# Patient Record
Sex: Female | Born: 1935 | Race: White | Hispanic: No
Health system: Southern US, Community
[De-identification: ages and names within clinical notes are randomized; demographics above are authoritative.]

---

## 1997-12-04 ENCOUNTER — Ambulatory Visit (HOSPITAL_COMMUNITY): Admission: RE | Admit: 1997-12-04 | Discharge: 1997-12-04 | Payer: Self-pay | Admitting: Internal Medicine

## 1997-12-04 ENCOUNTER — Encounter: Payer: Self-pay | Admitting: Internal Medicine

## 1998-12-09 ENCOUNTER — Ambulatory Visit (HOSPITAL_COMMUNITY): Admission: RE | Admit: 1998-12-09 | Discharge: 1998-12-09 | Payer: Self-pay | Admitting: Internal Medicine

## 1998-12-09 ENCOUNTER — Encounter: Payer: Self-pay | Admitting: Internal Medicine

## 1999-02-02 ENCOUNTER — Ambulatory Visit (HOSPITAL_COMMUNITY): Admission: RE | Admit: 1999-02-02 | Discharge: 1999-02-02 | Payer: Self-pay | Admitting: Gastroenterology

## 1999-03-15 ENCOUNTER — Ambulatory Visit: Admission: RE | Admit: 1999-03-15 | Discharge: 1999-03-15 | Payer: Self-pay | Admitting: Ophthalmology

## 1999-12-13 ENCOUNTER — Encounter: Payer: Self-pay | Admitting: Internal Medicine

## 1999-12-13 ENCOUNTER — Ambulatory Visit (HOSPITAL_COMMUNITY): Admission: RE | Admit: 1999-12-13 | Discharge: 1999-12-13 | Payer: Self-pay | Admitting: Internal Medicine

## 2001-03-13 ENCOUNTER — Ambulatory Visit (HOSPITAL_COMMUNITY): Admission: RE | Admit: 2001-03-13 | Discharge: 2001-03-13 | Payer: Self-pay | Admitting: Internal Medicine

## 2001-03-13 ENCOUNTER — Encounter: Payer: Self-pay | Admitting: Internal Medicine

## 2001-05-29 ENCOUNTER — Other Ambulatory Visit: Admission: RE | Admit: 2001-05-29 | Discharge: 2001-05-29 | Payer: Self-pay | Admitting: Internal Medicine

## 2002-02-27 ENCOUNTER — Ambulatory Visit (HOSPITAL_COMMUNITY): Admission: RE | Admit: 2002-02-27 | Discharge: 2002-02-27 | Payer: Self-pay | Admitting: Internal Medicine

## 2002-02-27 ENCOUNTER — Encounter: Payer: Self-pay | Admitting: Internal Medicine

## 2002-10-06 ENCOUNTER — Encounter (INDEPENDENT_AMBULATORY_CARE_PROVIDER_SITE_OTHER): Payer: Self-pay | Admitting: *Deleted

## 2002-10-06 ENCOUNTER — Encounter: Payer: Self-pay | Admitting: Emergency Medicine

## 2002-10-06 ENCOUNTER — Encounter: Payer: Self-pay | Admitting: General Surgery

## 2002-10-06 ENCOUNTER — Inpatient Hospital Stay (HOSPITAL_COMMUNITY): Admission: EM | Admit: 2002-10-06 | Discharge: 2002-10-12 | Payer: Self-pay | Admitting: Emergency Medicine

## 2002-10-28 ENCOUNTER — Encounter: Payer: Self-pay | Admitting: General Surgery

## 2002-10-28 ENCOUNTER — Ambulatory Visit (HOSPITAL_COMMUNITY): Admission: RE | Admit: 2002-10-28 | Discharge: 2002-10-28 | Payer: Self-pay | Admitting: General Surgery

## 2003-07-24 ENCOUNTER — Ambulatory Visit (HOSPITAL_COMMUNITY): Admission: RE | Admit: 2003-07-24 | Discharge: 2003-07-24 | Payer: Self-pay | Admitting: Internal Medicine

## 2004-09-07 ENCOUNTER — Ambulatory Visit (HOSPITAL_COMMUNITY): Admission: RE | Admit: 2004-09-07 | Discharge: 2004-09-07 | Payer: Self-pay | Admitting: Internal Medicine

## 2005-09-22 ENCOUNTER — Ambulatory Visit (HOSPITAL_COMMUNITY): Admission: RE | Admit: 2005-09-22 | Discharge: 2005-09-22 | Payer: Self-pay | Admitting: Internal Medicine

## 2006-09-07 ENCOUNTER — Ambulatory Visit (HOSPITAL_COMMUNITY): Admission: RE | Admit: 2006-09-07 | Discharge: 2006-09-07 | Payer: Self-pay | Admitting: Internal Medicine

## 2006-10-12 ENCOUNTER — Ambulatory Visit (HOSPITAL_COMMUNITY): Admission: RE | Admit: 2006-10-12 | Discharge: 2006-10-12 | Payer: Self-pay | Admitting: Internal Medicine

## 2007-12-04 ENCOUNTER — Ambulatory Visit (HOSPITAL_COMMUNITY): Admission: RE | Admit: 2007-12-04 | Discharge: 2007-12-04 | Payer: Self-pay | Admitting: Internal Medicine

## 2009-12-02 ENCOUNTER — Ambulatory Visit: Payer: Self-pay | Admitting: Cardiology

## 2009-12-02 ENCOUNTER — Inpatient Hospital Stay (HOSPITAL_COMMUNITY): Admission: EM | Admit: 2009-12-02 | Discharge: 2009-12-10 | Payer: Self-pay | Admitting: Emergency Medicine

## 2009-12-04 ENCOUNTER — Encounter (INDEPENDENT_AMBULATORY_CARE_PROVIDER_SITE_OTHER): Payer: Self-pay | Admitting: Internal Medicine

## 2009-12-05 ENCOUNTER — Encounter (INDEPENDENT_AMBULATORY_CARE_PROVIDER_SITE_OTHER): Payer: Self-pay | Admitting: Internal Medicine

## 2010-01-09 ENCOUNTER — Inpatient Hospital Stay (HOSPITAL_COMMUNITY): Admission: EM | Admit: 2010-01-09 | Discharge: 2010-01-10 | Payer: Self-pay | Admitting: Emergency Medicine

## 2010-07-01 LAB — CBC
HCT: 34 % — ABNORMAL LOW (ref 36.0–46.0)
HCT: 34.9 % — ABNORMAL LOW (ref 36.0–46.0)
HCT: 35.7 % — ABNORMAL LOW (ref 36.0–46.0)
Hemoglobin: 11.4 g/dL — ABNORMAL LOW (ref 12.0–15.0)
Hemoglobin: 11.6 g/dL — ABNORMAL LOW (ref 12.0–15.0)
Hemoglobin: 11.6 g/dL — ABNORMAL LOW (ref 12.0–15.0)
MCH: 30.8 pg (ref 26.0–34.0)
MCH: 30.5 pg (ref 26.0–34.0)
MCH: 30.6 pg (ref 26.0–34.0)
MCH: 31.5 pg (ref 26.0–34.0)
MCHC: 32.9 g/dL (ref 30.0–36.0)
MCV: 93.7 fL (ref 78.0–100.0)
MCV: 90.3 fL (ref 78.0–100.0)
MCV: 90.9 fL (ref 78.0–100.0)
MCV: 92.2 fL (ref 78.0–100.0)
MCV: 92.4 fL (ref 78.0–100.0)
MCV: 92.8 fL (ref 78.0–100.0)
Platelets: 148 10*3/uL — ABNORMAL LOW (ref 150–400)
Platelets: 103 10*3/uL — ABNORMAL LOW (ref 150–400)
Platelets: 113 10*3/uL — ABNORMAL LOW (ref 150–400)
Platelets: 143 10*3/uL — ABNORMAL LOW (ref 150–400)
Platelets: 147 10*3/uL — ABNORMAL LOW (ref 150–400)
Platelets: 159 10*3/uL (ref 150–400)
RBC: 4.12 MIL/uL (ref 3.87–5.11)
RBC: 3.68 MIL/uL — ABNORMAL LOW (ref 3.87–5.11)
RBC: 3.74 MIL/uL — ABNORMAL LOW (ref 3.87–5.11)
RBC: 3.87 MIL/uL (ref 3.87–5.11)
RBC: 4.16 MIL/uL (ref 3.87–5.11)
RBC: 4.27 MIL/uL (ref 3.87–5.11)
RDW: 14.6 % (ref 11.5–15.5)
RDW: 15.4 % (ref 11.5–15.5)
WBC: 10.4 10*3/uL (ref 4.0–10.5)
WBC: 6 10*3/uL (ref 4.0–10.5)
WBC: 7.3 10*3/uL (ref 4.0–10.5)
WBC: 7.7 10*3/uL (ref 4.0–10.5)
WBC: 8.1 10*3/uL (ref 4.0–10.5)
WBC: 8.3 10*3/uL (ref 4.0–10.5)

## 2010-07-01 LAB — BASIC METABOLIC PANEL
BUN: 14 mg/dL (ref 6–23)
BUN: 25 mg/dL — ABNORMAL HIGH (ref 6–23)
BUN: 26 mg/dL — ABNORMAL HIGH (ref 6–23)
CO2: 27 mEq/L (ref 19–32)
CO2: 29 mEq/L (ref 19–32)
Calcium: 9.1 mg/dL (ref 8.4–10.5)
Chloride: 100 mEq/L (ref 96–112)
Chloride: 103 mEq/L (ref 96–112)
Chloride: 103 mEq/L (ref 96–112)
Chloride: 106 mEq/L (ref 96–112)
Chloride: 106 mEq/L (ref 96–112)
Creatinine, Ser: 1.06 mg/dL (ref 0.4–1.2)
Creatinine, Ser: 1.13 mg/dL (ref 0.4–1.2)
Creatinine, Ser: 1.17 mg/dL (ref 0.4–1.2)
Creatinine, Ser: 1.2 mg/dL (ref 0.4–1.2)
GFR calc Af Amer: 51 mL/min — ABNORMAL LOW (ref >60)
GFR calc Af Amer: 59 mL/min — ABNORMAL LOW (ref >60)
GFR calc non Af Amer: 42 mL/min — ABNORMAL LOW (ref >60)
GFR calc non Af Amer: 40 mL/min — ABNORMAL LOW (ref >60)
GFR calc non Af Amer: 43 mL/min — ABNORMAL LOW (ref >60)
GFR calc non Af Amer: 49 mL/min — ABNORMAL LOW (ref >60)
Glucose, Bld: 101 mg/dL — ABNORMAL HIGH (ref 70–99)
Glucose, Bld: 99 mg/dL (ref 70–99)
Glucose, Bld: 99 mg/dL (ref 70–99)
Potassium: 3.5 mEq/L (ref 3.5–5.1)
Potassium: 3.9 mEq/L (ref 3.5–5.1)
Potassium: 4.1 mEq/L (ref 3.5–5.1)
Potassium: 4.2 mEq/L (ref 3.5–5.1)
Potassium: 4.2 mEq/L (ref 3.5–5.1)
Potassium: 4.5 mEq/L (ref 3.5–5.1)
Potassium: 4.8 mEq/L (ref 3.5–5.1)
Sodium: 141 mEq/L (ref 135–145)
Sodium: 140 mEq/L (ref 135–145)
Sodium: 140 mEq/L (ref 135–145)
Sodium: 142 mEq/L (ref 135–145)

## 2010-07-01 LAB — CARDIAC PANEL(CRET KIN+CKTOT+MB+TROPI)
CK, MB: 3 ng/mL (ref 0.3–4.0)
CK, MB: 3.1 ng/mL (ref 0.3–4.0)
CK, MB: 5.7 ng/mL — ABNORMAL HIGH (ref 0.3–4.0)
Relative Index: INVALID (ref 0.0–2.5)
Relative Index: INVALID (ref 0.0–2.5)
Total CK: 44 U/L (ref 7–177)
Troponin I: 0.3 ng/mL — ABNORMAL HIGH (ref 0.00–0.06)
Troponin I: 0.44 ng/mL — ABNORMAL HIGH (ref 0.00–0.06)
Troponin I: 0.55 ng/mL (ref 0.00–0.06)
Troponin I: 0.85 ng/mL (ref 0.00–0.06)
Troponin I: 0.94 ng/mL (ref 0.00–0.06)

## 2010-07-01 LAB — COMPREHENSIVE METABOLIC PANEL
ALT: 27 U/L (ref 0–35)
ALT: 22 U/L (ref 0–35)
ALT: 22 U/L (ref 0–35)
AST: 32 U/L (ref 0–37)
AST: 28 U/L (ref 0–37)
AST: 34 U/L (ref 0–37)
Alkaline Phosphatase: 74 U/L (ref 39–117)
Alkaline Phosphatase: 88 U/L (ref 39–117)
CO2: 30 mEq/L (ref 19–32)
CO2: 23 mEq/L (ref 19–32)
CO2: 24 mEq/L (ref 19–32)
Calcium: 9.5 mg/dL (ref 8.4–10.5)
Calcium: 9.1 mg/dL (ref 8.4–10.5)
Chloride: 104 mEq/L (ref 96–112)
Chloride: 105 mEq/L (ref 96–112)
Chloride: 96 mEq/L (ref 96–112)
Creatinine, Ser: 0.96 mg/dL (ref 0.4–1.2)
Creatinine, Ser: 1.01 mg/dL (ref 0.4–1.2)
GFR calc Af Amer: >60 mL/min (ref >60)
GFR calc Af Amer: 47 mL/min — ABNORMAL LOW (ref >60)
GFR calc Af Amer: >60 mL/min (ref >60)
GFR calc non Af Amer: 55 mL/min — ABNORMAL LOW (ref >60)
GFR calc non Af Amer: 38 mL/min — ABNORMAL LOW (ref >60)
GFR calc non Af Amer: 51 mL/min — ABNORMAL LOW (ref >60)
Glucose, Bld: 103 mg/dL — ABNORMAL HIGH (ref 70–99)
Potassium: 4 mEq/L (ref 3.5–5.1)
Potassium: 4 mEq/L (ref 3.5–5.1)
Sodium: 128 mEq/L — ABNORMAL LOW (ref 135–145)
Sodium: 137 mEq/L (ref 135–145)
Total Bilirubin: 1.2 mg/dL (ref 0.3–1.2)
Total Bilirubin: 0.6 mg/dL (ref 0.3–1.2)
Total Bilirubin: 0.7 mg/dL (ref 0.3–1.2)

## 2010-07-01 LAB — DIFFERENTIAL
Basophils Absolute: 0 10*3/uL (ref 0.0–0.1)
Basophils Absolute: 0 10*3/uL (ref 0.0–0.1)
Basophils Relative: 0 % (ref 0–1)
Basophils Relative: 1 % (ref 0–1)
Eosinophils Absolute: 0.2 10*3/uL (ref 0.0–0.7)
Eosinophils Absolute: 0.1 10*3/uL (ref 0.0–0.7)
Eosinophils Absolute: 0.1 10*3/uL (ref 0.0–0.7)
Eosinophils Relative: 4 % (ref 0–5)
Eosinophils Relative: 2 % (ref 0–5)
Eosinophils Relative: 2 % (ref 0–5)
Lymphocytes Relative: 22 % (ref 12–46)
Lymphs Abs: 1.8 10*3/uL (ref 0.7–4.0)
Monocytes Absolute: 0.8 10*3/uL (ref 0.1–1.0)
Neutrophils Relative %: 62 % (ref 43–77)

## 2010-07-01 LAB — CK TOTAL AND CKMB (NOT AT ARMC)
CK, MB: 2.8 ng/mL (ref 0.3–4.0)
Relative Index: INVALID (ref 0.0–2.5)

## 2010-07-01 LAB — URINE CULTURE: Culture  Setup Time: 201108172320

## 2010-07-01 LAB — BLOOD GAS, VENOUS
Drawn by: 24486
O2 Content: 2 L/min
O2 Saturation: 67 %
Patient temperature: 98.6

## 2010-07-01 LAB — APTT: aPTT: 33 seconds (ref 24–37)

## 2010-07-01 LAB — URINALYSIS, ROUTINE W REFLEX MICROSCOPIC
Leukocytes, UA: NEGATIVE
Nitrite: NEGATIVE
Protein, ur: NEGATIVE mg/dL
Urobilinogen, UA: 0.2 mg/dL (ref 0.0–1.0)

## 2010-07-01 LAB — TSH: TSH: 0.087 u[IU]/mL — ABNORMAL LOW (ref 0.350–4.500)

## 2010-07-01 LAB — LIPID PANEL
HDL: 47 mg/dL (ref >39)
Total CHOL/HDL Ratio: 3 RATIO
VLDL: 14 mg/dL (ref 0–40)

## 2010-07-01 LAB — CULTURE, BLOOD (ROUTINE X 2)
Culture: NO GROWTH
Culture: NO GROWTH

## 2010-07-01 LAB — BRAIN NATRIURETIC PEPTIDE
Pro B Natriuretic peptide (BNP): 1903 pg/mL — ABNORMAL HIGH (ref 0.0–100.0)
Pro B Natriuretic peptide (BNP): 1699 pg/mL — ABNORMAL HIGH (ref 0.0–100.0)
Pro B Natriuretic peptide (BNP): 2335 pg/mL — ABNORMAL HIGH (ref 0.0–100.0)

## 2010-07-01 LAB — POCT CARDIAC MARKERS: Troponin i, poc: <0.05 ng/mL (ref 0.00–0.09)

## 2010-07-01 LAB — URINE MICROSCOPIC-ADD ON

## 2010-07-01 LAB — TROPONIN I
Troponin I: 0.03 ng/mL (ref 0.00–0.06)
Troponin I: 0.27 ng/mL — ABNORMAL HIGH (ref 0.00–0.06)

## 2010-07-01 LAB — HEPARIN LEVEL (UNFRACTIONATED)
Heparin Unfractionated: 0.25 IU/mL — ABNORMAL LOW (ref 0.30–0.70)
Heparin Unfractionated: 0.47 IU/mL (ref 0.30–0.70)

## 2010-07-01 LAB — HEMOCCULT GUIAC POC 1CARD (OFFICE): Fecal Occult Bld: NEGATIVE

## 2010-07-01 LAB — D-DIMER, QUANTITATIVE: D-Dimer, Quant: 1.96 ug/mL-FEU — ABNORMAL HIGH (ref 0.00–0.48)

## 2010-07-01 LAB — PROTIME-INR: Prothrombin Time: 14.8 seconds (ref 11.6–15.2)

## 2010-09-03 NOTE — Discharge Summary (Signed)
   NAMEMarland Kitchen  [name] Copeland, [name] Copeland                          ACCOUNT NO.:  [id number]   MEDICAL RECORD NO.:  [id number]                   PATIENT TYPE:  INP   LOCATION:  5729                                 FACILITY:  [facility]   PHYSICIAN:  Jimmye Norman III, M.D.               DATE OF BIRTH:  1916-07-20   DATE OF ADMISSION:  10/06/2002  DATE OF DISCHARGE:  10/12/2002                                 DISCHARGE SUMMARY   DISCHARGE DIAGNOSIS:  Acute cholecystitis and cholelithiasis, and poor  drainage of the common bile duct with a dilated common bile duct.   PRINCIPAL PROCEDURE:  Open cholecystectomy with cholangiogram and common  bile duct exploration and placement of  T-tube.   DISCHARGE MEDICATIONS:  1. Darvocet N 100.  2. Continue to take p.o. medications and restart aspirin in 4-5 days.   CONDITION ON DISCHARGE:  Stable.   BRIEF SUMMARY OF HOSPITAL COURSE:  The patient was admitted to the hospital  with abdominal pain and evidence of acute cholecystitis on recent studies.  She was taken to the operating room for an open cholecystectomy primarily  because she had a large ventral hernia which we did not feel as though would  make it safe to do a laparoscopic procedure.   She went to surgery on October 06, 2002 at which time an open cholecystectomy  was done along with a partial ventral hernia repair.  It was difficult  because of the patient's previous hernia.  Intraoperatively a cholangiogram  showed no evidence of intraductal stone, however there was poor  drainage  into the duodenum.  This was repeated on multiple occasions including with  the injection of Glucagon circling for at least a minute.  A common duct  exploration was performed which demonstrated no evidence of stones.  A T-  tube was left in place.   Postoperatively she improved after the first couple of days with some  confusion and fevers and subsequently  by postoperative day number six her  highest temperature had been 100.5,  she was doing well otherwise, tolerating  a diet well.  She is to come back to see me in two weeks.                                               Kathrin Ruddy, M.D.    JW/MEDQ  D:  10/24/2002  T:  10/25/2002  Job:  [id number]

## 2010-09-03 NOTE — H&P (Signed)
NAMEMarland Kitchen  [name] Copeland, [name] Copeland                          ACCOUNT NO.:  [id number]   MEDICAL RECORD NO.:  [id number]                   PATIENT TYPE:  INP   LOCATION:  1823                                 FACILITY:  [facility]   PHYSICIAN:  Jimmye Norman III, M.D.               DATE OF BIRTH:  Jan 04, 1917   DATE OF ADMISSION:  10/06/2002  DATE OF DISCHARGE:                                HISTORY & PHYSICAL   CHIEF COMPLAINT:  The patient is an 77 year old with symptomatic  cholelithiasis and subacute cholecystitis.   HISTORY OF PRESENT ILLNESS:  The patient has had 3-4 attacks this week of  abdominal pain in the epigastrium and right upper quadrant associated with  nausea, no vomiting.  This latest attack was the worst of all.  It came at  about 1 a.m., awakened her from sleep, but following a heavy meal that she  had the previous evening.  She had no idea that she had gallstones, but had  been sort of suspected of having cholecystitis 35 years ago, but an  ultrasound at that time was negative.  She came to the emergency room where  she had an ultrasound done which showed gallstones, along with normal common  bile duct and a non-thickened or inflamed gallbladder wall.  Surgical  consultation was obtained.   PAST MEDICAL HISTORY:  1. Hypothyroidism.  2. Hypertension.  3. History of diverticulosis and diverticulitis.  4. Possible reflux disease.   MEDICATIONS:  1. She has a blood pressure pill that she is unaware of.  2. Aspirin.  3. Synthroid.  4. Hydrochlorothiazide 25 mg daily.   ALLERGIES:  No known drug allergies.   REVIEW OF SYSTEMS:  She does not drink alcohol or smoke or take any other  drugs or medications.   FAMILY HISTORY:  Unremarkable, noncontributory.   PHYSICAL EXAMINATION:  VITAL SIGNS:  She is afebrile.  Other vital signs are  stable.  She is normocephalic and atraumatic and anicteric.  NECK:  Supple with no palpable masses, no thyroid masses, no bruits.  CARDIAC:  Regular  rhythm and rate with no murmurs, gallops, lifts, or  heaves.  LUNGS:  Clear to auscultation and percussion.  ABDOMEN:  Soft with some mild epigastric and right upper quadrant  tenderness.  She has an old midline scar from her previous colectomy with a  right-sided ventral hernia which is partially reducible but mildly tender.  RECTAL AND PELVIC:  Deferred.   LABORATORY DATA:  She has a mildly elevated AST.  Her white count is  slightly up at 11.7.  She is dehydrated with a hematocrit of 45%.  Ultrasound demonstrates gallstones.    IMPRESSION:  1. Subacute cholecystitis versus cholelithiasis and a mildly elevated white     count.  2. Ventral hernia, stable, without strangulation.   PLAN:  Because of the periumbilical and right-sided ventral hernia and a  previous colectomy, I will  elect to go ahead and do an open cholecystectomy  on this patient with cholangiogram.  This will be done as soon as possible.  The patient will get some perioperative antibiotics and then go straight for  surgical intervention.  The risks of the procedure have been explained to  the patient including the reason why we are not going for a laparoscopic  repair versus an open repair.  The patient understands and wishes to proceed  along with the family which included her daughter and her son-in-law and  son.                                               Kathrin Ruddy, M.D.    JW/MEDQ  D:  10/06/2002  T:  10/07/2002  Job:  [id number]

## 2010-09-03 NOTE — Op Note (Signed)
NAME:  [name] Copeland, [name] Copeland                          ACCOUNT NO.:  [id number]   MEDICAL RECORD NO.:  [id number]                   PATIENT TYPE:  INP   LOCATION:  MAMO                                 FACILITY:  [facility]   PHYSICIAN:  Jimmye Norman III, M.D.               DATE OF BIRTH:  [birth year]   DATE OF PROCEDURE:  10/06/2002  DATE OF DISCHARGE:                                 OPERATIVE REPORT   PREOPERATIVE DIAGNOSES:  Symptomatic cholelithiasis and subacute  cholecystitis.   POSTOPERATIVE DIAGNOSES:  Symptomatic cholelithiasis and subacute  cholecystitis with ventral hernia and Swiss cheese pattern of fascial break  down.   OPERATION PERFORMED:  1. Open cholecystectomy.  2. Intraoperative cholangiogram.  3. Common bile duct exploration.  4. Partial repair of ventral hernia.   SURGEON:  Marta Lamas. Lindie Spruce, M.D.   ASSISTANT:  Gita Kudo, M.D.   ANESTHESIA:  General endotracheal.   ESTIMATED BLOOD LOSS:  Less than .   COMPLICATIONS:  None.   CONDITION:  Stable.   OPERATIVE FINDINGS:  Abnormal cholangiogram on initial cholangiography.  No  stones found within the duct but poor flow into the duodenum but we did find  a definitive flow.  The fascia along the midline and right upper quadrant  was in a Swiss cheese pattern with multiple fascial defects which were  partially closed at the closure of the procedure.   DESCRIPTION OF PROCEDURE:  The patient was taken to the operating room and  placed on the table in the supine position.  After an adequate endotracheal  anesthetic was administered, the patient was prepped and draped in the usual  sterile manner exposing the midline and her right upper quadrant.  We made a  right subcostal incision using a #10 blade and immediately upon getting into  the subcu could see that the incision was near one of the fascial defects  along the patient's previous midline fascia incision which had been done for  colectomy.  We took it down  to and through the midline through the anterior  rectus sheath and then the rectus muscle and then the posterior sheath;  however, there was less than a 5 mm area between the cut fascia and sheath  and the hernia defect.   Upon entering the peritoneal cavity, we could find a free space; however,  there was omental adhesions up towards the hernia defect in the right upper  quadrant in the midline.  This had to be taken down using blunt dissection  and also electrocautery in order to expose adequately the right upper  quadrant for the dissection.  The gallbladder was tense but the gallbladder  was not necrotic but seemed to be mildly inflamed.  We aspirated it using  gallbladder trocar, spilling some bile in the process, then tying off the  cholecystostomy using a pursestring suture of 2-0 silk.  We then put a  [name]  bar retractor in place as we went ahead to dissect out the  gallbladder itself.  With the [facility] bar retractor in place, the patient  was placed reverse Trendelenburg position.  We could dissect out the  peritoneum overlying the triangle of Calot and hepatoduodenal  using a right  angle clamp.  We were able to isolate the cystic duct and the cystic artery  adequately in order to see the artery and the cystic duct.  As the artery  came to the triangle of Calot, we clipped it proximally and distally,  getting adequate hemostasis.  We double clipped it proximally.  We then  transected the artery then made a cholecystodochotomy using Metzenbaum  scissors.  We passed the __________ catheter into the cholecystodochotomy,  secured in place with the clip.  The cholangiogram showed flow into the  common bile duct but no flow into the duodenum which was even after a  minute's worth of circulation of Glucagon.  Based on this information, we  went ahead and removed the gallbladder, tied off the cystic duct and  performed a common bile duct exploration.   With adequate retraction in  place, we put two stay sutures on the side of  the common bile duct at the site of the junction of the cystic duct.  We  made a choledochotomy using a #15 blade longitudinally and subsequently  passed multiple instruments into the common duct in order to retrieve what  was felt to be a common duct stone.  However, in spite of passing a red  Robinson catheter 12 [place] and irrigating with force, a biliary stone  catheter, Randall stone forceps and big stylus, we failed to retrieve any  fragments of stones or full stones.  We subsequently placed an 65 [place] x  12 French T-tube into the choledochotomy, secured it in place with a 3-0  interrupted 3-0 PDS sutures.  We then shot a cholangiogram which showed  there was some flow into the duodenum  with a smooth tapering of the distal  common bile duct.  Proximally, there were no problems with the ducts.  We  subsequently brought the T-tube out the lateral aspect of the abdominal wall  along with a 19 mm Blake drain which was placed in Morrison's pouch.  Both  were secured in place with 2-0 nylons.  We then closed the fascia as well as  we could.  The Swiss cheese portion of the upper portion of the mid medial  aspect of the fascia was reapproximated with interrupted double-arm Vicryl  sutures trying to perform a solidified part of fascia in order to close  towards the midline. We then used a running #1 PDS suture to close the  fascia with interrupted figure-of-eight sutures of #1 Vicryl intervening in  order to close the fascia.  A single layer was done as it was not possible  to do anterior and posterior layers of the sheath.  Once we had closed in a  single layer, using #1 PDS, we closed the skin using stainless steel  staples. The T-tube was attached to a bile bag and a Blake drain was  attached to a bulb suction.  Sponge, needle and instrument counts were correct.  We irrigated subcu with saline and closed the skin with staples.  Sterile  dressings were applied.  Kathrin Ruddy, M.D.    JW/MEDQ  D:  10/06/2002  T:  10/07/2002  Job:  [id number]

## 2010-11-27 ENCOUNTER — Inpatient Hospital Stay (HOSPITAL_COMMUNITY)
Admission: EM | Admit: 2010-11-27 | Discharge: 2010-12-18 | DRG: 291 | Disposition: E | Payer: Medicare Other | Attending: Internal Medicine | Admitting: Internal Medicine

## 2010-11-27 ENCOUNTER — Emergency Department (HOSPITAL_COMMUNITY): Payer: Medicare Other

## 2010-11-27 DIAGNOSIS — I509 Heart failure, unspecified: Principal | ICD-10-CM | POA: Diagnosis present

## 2010-11-27 DIAGNOSIS — J9600 Acute respiratory failure, unspecified whether with hypoxia or hypercapnia: Secondary | ICD-10-CM | POA: Diagnosis present

## 2010-11-27 DIAGNOSIS — E039 Hypothyroidism, unspecified: Secondary | ICD-10-CM | POA: Diagnosis present

## 2010-11-27 DIAGNOSIS — F411 Generalized anxiety disorder: Secondary | ICD-10-CM | POA: Diagnosis present

## 2010-11-27 DIAGNOSIS — E871 Hypo-osmolality and hyponatremia: Secondary | ICD-10-CM | POA: Diagnosis present

## 2010-11-27 DIAGNOSIS — Z7982 Long term (current) use of aspirin: Secondary | ICD-10-CM

## 2010-11-27 DIAGNOSIS — R627 Adult failure to thrive: Secondary | ICD-10-CM | POA: Diagnosis present

## 2010-11-27 DIAGNOSIS — G8929 Other chronic pain: Secondary | ICD-10-CM | POA: Diagnosis present

## 2010-11-27 DIAGNOSIS — K219 Gastro-esophageal reflux disease without esophagitis: Secondary | ICD-10-CM | POA: Diagnosis present

## 2010-11-27 DIAGNOSIS — Z79899 Other long term (current) drug therapy: Secondary | ICD-10-CM

## 2010-11-27 DIAGNOSIS — E669 Obesity, unspecified: Secondary | ICD-10-CM | POA: Diagnosis present

## 2010-11-27 DIAGNOSIS — I2510 Atherosclerotic heart disease of native coronary artery without angina pectoris: Secondary | ICD-10-CM | POA: Diagnosis present

## 2010-11-27 DIAGNOSIS — I10 Essential (primary) hypertension: Secondary | ICD-10-CM | POA: Diagnosis present

## 2010-11-27 DIAGNOSIS — K5730 Diverticulosis of large intestine without perforation or abscess without bleeding: Secondary | ICD-10-CM | POA: Diagnosis present

## 2010-11-27 DIAGNOSIS — R0902 Hypoxemia: Secondary | ICD-10-CM | POA: Diagnosis present

## 2010-11-27 LAB — DIFFERENTIAL
Basophils Absolute: 0 10*3/uL (ref 0.0–0.1)
Lymphocytes Relative: 16 % (ref 12–46)
Lymphs Abs: 2 10*3/uL (ref 0.7–4.0)
Monocytes Absolute: 0.7 10*3/uL (ref 0.1–1.0)
Neutro Abs: 9.8 10*3/uL — ABNORMAL HIGH (ref 1.7–7.7)

## 2010-11-27 LAB — CBC
HCT: 35.3 % — ABNORMAL LOW (ref 36.0–46.0)
Hemoglobin: 12.1 g/dL (ref 12.0–15.0)
MCH: 30.5 pg (ref 26.0–34.0)
MCHC: 34.3 g/dL (ref 30.0–36.0)
MCV: 88.9 fL (ref 78.0–100.0)
RDW: 13.6 % (ref 11.5–15.5)

## 2010-11-27 LAB — BASIC METABOLIC PANEL
BUN: 31 mg/dL — ABNORMAL HIGH (ref 6–23)
CO2: 26 mEq/L (ref 19–32)
Chloride: 89 mEq/L — ABNORMAL LOW (ref 96–112)
Creatinine, Ser: 1.37 mg/dL — ABNORMAL HIGH (ref 0.50–1.10)
GFR calc Af Amer: 44 mL/min — ABNORMAL LOW (ref >60)
Glucose, Bld: 183 mg/dL — ABNORMAL HIGH (ref 70–99)
Potassium: 3.9 mEq/L (ref 3.5–5.1)

## 2010-11-27 LAB — URINALYSIS, ROUTINE W REFLEX MICROSCOPIC
Bilirubin Urine: NEGATIVE
Leukocytes, UA: NEGATIVE
Nitrite: NEGATIVE
Specific Gravity, Urine: 1.017 (ref 1.005–1.030)
Urobilinogen, UA: 0.2 mg/dL (ref 0.0–1.0)

## 2010-11-27 LAB — CK TOTAL AND CKMB (NOT AT ARMC)
CK, MB: 4 ng/mL (ref 0.3–4.0)
Relative Index: INVALID (ref 0.0–2.5)

## 2010-11-28 LAB — CBC
Hemoglobin: 12 g/dL (ref 12.0–15.0)
MCH: 30.2 pg (ref 26.0–34.0)
MCHC: 33.6 g/dL (ref 30.0–36.0)
RDW: 13.8 % (ref 11.5–15.5)

## 2010-11-28 LAB — GLUCOSE, CAPILLARY
Glucose-Capillary: 189 mg/dL — ABNORMAL HIGH (ref 70–99)
Glucose-Capillary: 142 mg/dL — ABNORMAL HIGH (ref 70–99)

## 2010-11-28 LAB — COMPREHENSIVE METABOLIC PANEL
BUN: 32 mg/dL — ABNORMAL HIGH (ref 6–23)
Calcium: 9.4 mg/dL (ref 8.4–10.5)
GFR calc Af Amer: 42 mL/min — ABNORMAL LOW (ref >60)
Glucose, Bld: 218 mg/dL — ABNORMAL HIGH (ref 70–99)
Sodium: 127 mEq/L — ABNORMAL LOW (ref 135–145)
Total Protein: 6.9 g/dL (ref 6.0–8.3)

## 2010-11-28 LAB — CARDIAC PANEL(CRET KIN+CKTOT+MB+TROPI)
CK, MB: 7.8 ng/mL (ref 0.3–4.0)
Relative Index: INVALID (ref 0.0–2.5)
Total CK: 139 U/L (ref 7–177)

## 2010-11-28 LAB — LIPID PANEL
Cholesterol: 132 mg/dL (ref 0–200)
HDL: 63 mg/dL (ref >39)
Total CHOL/HDL Ratio: 2.1 RATIO

## 2010-11-28 LAB — MRSA PCR SCREENING: MRSA by PCR: NEGATIVE

## 2010-11-28 LAB — TSH: TSH: 0.128 u[IU]/mL — ABNORMAL LOW (ref 0.350–4.500)

## 2010-11-29 ENCOUNTER — Inpatient Hospital Stay (HOSPITAL_COMMUNITY): Payer: Medicare Other

## 2010-11-29 LAB — BASIC METABOLIC PANEL
BUN: 54 mg/dL — ABNORMAL HIGH (ref 6–23)
CO2: 20 mEq/L (ref 19–32)
CO2: 24 mEq/L (ref 19–32)
Calcium: 9.4 mg/dL (ref 8.4–10.5)
Chloride: 87 mEq/L — ABNORMAL LOW (ref 96–112)
Chloride: 88 mEq/L — ABNORMAL LOW (ref 96–112)
Creatinine, Ser: 2.5 mg/dL — ABNORMAL HIGH (ref 0.50–1.10)
GFR calc Af Amer: 28 mL/min — ABNORMAL LOW (ref >60)
GFR calc Af Amer: 22 mL/min — ABNORMAL LOW (ref >60)
GFR calc Af Amer: 21 mL/min — ABNORMAL LOW (ref >60)
Glucose, Bld: 153 mg/dL — ABNORMAL HIGH (ref 70–99)
Potassium: 6.1 mEq/L — ABNORMAL HIGH (ref 3.5–5.1)
Sodium: 123 mEq/L — ABNORMAL LOW (ref 135–145)
Sodium: 124 mEq/L — ABNORMAL LOW (ref 135–145)

## 2010-11-29 LAB — BLOOD GAS, ARTERIAL
Bicarbonate: 20.1 mEq/L (ref 20.0–24.0)
Bicarbonate: 21.8 mEq/L (ref 20.0–24.0)
Drawn by: 290171
Expiratory PAP: 5
FIO2: 0.4 %
Inspiratory PAP: 10
O2 Saturation: 98.5 %
Patient temperature: 98.6
Patient temperature: 98.6
TCO2: 22.8 mmol/L (ref 0–100)
pCO2 arterial: 32.7 mmHg — ABNORMAL LOW (ref 35.0–45.0)
pH, Arterial: 7.438 — ABNORMAL HIGH (ref 7.350–7.400)
pO2, Arterial: 64.2 mmHg — ABNORMAL LOW (ref 80.0–100.0)

## 2010-11-29 LAB — URINE CULTURE: Culture  Setup Time: 201208121138

## 2010-11-29 LAB — GLUCOSE, CAPILLARY: Glucose-Capillary: 145 mg/dL — ABNORMAL HIGH (ref 70–99)

## 2010-11-30 ENCOUNTER — Inpatient Hospital Stay (HOSPITAL_COMMUNITY): Payer: Medicare Other

## 2010-11-30 LAB — GLUCOSE, CAPILLARY: Glucose-Capillary: 120 mg/dL — ABNORMAL HIGH (ref 70–99)

## 2010-12-04 LAB — CULTURE, BLOOD (ROUTINE X 2)
Culture: NO GROWTH
Culture: NO GROWTH

## 2010-12-04 NOTE — H&P (Signed)
[name], [name] NO.:  [id number]  MEDICAL RECORD NO.:  [id number]  LOCATION:  2920                         FACILITY:  [facility]  PHYSICIAN:  Della Goo, M.D. DATE OF BIRTH:  [birth year]  DATE OF ADMISSION:   DATE OF DISCHARGE:                             HISTORY & PHYSICAL   DATE OF ADMISSION:  November 27, 2010.  PRIMARY CARE PHYSICIAN:  Dr. Michele Mcalpine.  CARDIOLOGIST:  Nicki Guadalajara, MD of [facility].  CHIEF COMPLAINT:  Shortness of breath.  HISTORY OF PRESENT ILLNESS:  This is a 77 year old female with a history of biventricular congestive heart failure syndrome who presents to the emergency department secondary to complaints of worsening shortness of breath over the past 24 hours.  EMS was called to the patient's home and she was brought to the emergency department.  The patient was administered Combivent, albuterol and IV Solu-Medrol on route.  In the emergency department, the patient was found to be hypoxemic.  She was placed on BiPAP therapy and had improvement in her O2 saturations.  Her emergency department workup did reveal a chest x-ray with pulmonary vascular congestion and small bilateral pleural effusions.  A beta- natriuretic peptide was also found to be elevated at 34,256.  The patient was administered 60 mg of IV Lasix x1 dose.  The patient had been hospitalized in August 2011.  She had a cardiac evaluation performed by Dr. Valera Castle and a 2-D echo study as well. At that time, the 2-D echo study did reveal grade 2 diastolic dysfunction and mild systolic heart failure as well with an ejection fraction at that time of 40-45%.  PAST MEDICAL HISTORY:  Significant for; 1. Congestive heart failure syndrome. 2. Hypertension. 3. Hypothyroidism. 4. Coronary artery disease. 5. Gastroesophageal reflux disease. 6. Diverticulosis. 7. Anxiety.  PAST SURGICAL HISTORY:  History of cholecystectomy in 2004.  MEDICATIONS:  At this  time include; aspirin, Lasix, metoprolol tartrate, pravastatin, Synthroid, temazepam, Vasotec, levothyroxine, pravastatin, cetirizine.  ALLERGIES:  No known drug allergies.  SOCIAL HISTORY:  The patient lives alone.  Her son lives nearby and he is her primary caretaker and administers her medications.  The patient is able to perform all of her ADLs and walks with a cane at times.  She is a nonsmoker, nondrinker and has no history of illicit drug usage.  FAMILY HISTORY:  Noncontributory secondary to her age.  REVIEW OF SYSTEMS:  Pertinent to mentioned above.  PHYSICAL EXAMINATION:  GENERAL:  This is an 77 year old elderly obese Caucasian female who is in mild distress. VITAL SIGNS:  Temperature 98.7 rectally, blood pressure 131/69, heart rate 89, respirations 36.  Her O2 sats were now 99-100% on BiPAP. HEENT:  Normocephalic, atraumatic.  Pupils equally round, reactive to light.  Extraocular movements are intact.  Funduscopic benign.  There is no scleral icterus.  Nares are patent bilaterally.  Oropharynx is clear. She has a BiPAP mask on at this time covering her nose and mouth. NECK:  Supple.  No thyromegaly, adenopathy, jugular venous distention. She has full range of motion. CARDIOVASCULAR:  Regular rate and rhythm.  No murmurs, gallops or rubs. LUNGS:  Clear to auscultation bilaterally.  No rales, rhonchi  or wheezes. ABDOMEN:  Positive bowel sounds, soft, nontender, nondistended.  No hepatosplenomegaly.  No rebound.  No guarding. EXTREMITIES:  Without cyanosis, clubbing.  There is trace edema. NEUROLOGIC:  The patient is alert and oriented x3.  She is able to move all 4 of her extremities and there is no generalized weakness.  There are no focal deficits.  LABORATORY STUDIES:  White blood cell count 12.6, hemoglobin 12.1, hematocrit 35.3, MCV 88.9, platelets are 183, neutrophils 78%, lymphocytes 16%.  Sodium 128, potassium 3.9, chloride 89, CO2 of 26, BUN 31, creatinine 1.37,  calcium 9.2, lactic acid level 2.1.  Urinalysis negative and cardiac enzymes with total CK 67, CK-MB 4.0 and troponin of 0.14.  Beta-natriuretic peptide [id number].  Chest x-ray as mentioned above in the HPI.  EKG reveals a sinus rhythm and a left bundle branch block is seen, nonspecific ST-segment changes were also seen.  ASSESSMENT:  A 77 year old female being admitted with; 1. Shortness of breath/acute respiratory failure. 2. Acute on chronic biventricular congestive heart failure with     decompensation. 3. Hypertension. 4. Hyponatremia. 5. Hypothyroidism. 6. Hyperglycemia.  PLAN:  The patient will be admitted to the step-down ICU area since she is on BiPAP therapy and her condition is serious at this time.  She is a full code.  IV Lasix therapy with supplemental potassium has been ordered.  Her electrolytes will be monitored and replaced as needed. She has been placed on the CHF protocol.  Her medications will be further reconciled.  DVT prophylaxis has also been ordered and a 2-D echo has been ordered since her last 2-D echo was exactly a year ago.  Cardiac enzymes will also be performed and the patient will be placed on nitro paste therapy.  Further workup will ensue pending results of the patient's clinical course.     Della Goo, M.D.     HJ/MEDQ  D:  11/28/2010  T:  11/28/2010  Job:  [id number]  cc:   Dr. Michele Mcalpine  Electronically Signed by Della Goo M.D. on 12/04/2010 04:47:38 AM

## 2010-12-18 DEATH — deceased

## 2011-01-02 NOTE — Discharge Summary (Signed)
[name] Copeland, [name] Copeland                ACCOUNT NO.:  [id number]  MEDICAL RECORD NO.:  [id number]  LOCATION:  2901                         FACILITY:  [facility]  PHYSICIAN:  Kela Millin, M.D.DATE OF BIRTH:  05-24-1916  DATE OF ADMISSION:  17-Dec-2010 DATE OF DISCHARGE:  11/29/2010                        DISCHARGE SUMMARY - REFERRING   DEATH SUMMARY  DATE OF DEATH:  2011/01/20  FINAL DIAGNOSES: 1. Acute-on-chronic combined systolic and diastolic heart failure. 2. Critical/severe aortic stenosis. 3. Hyponatremia. 4. Hypotension -- secondary to acute-on-chronic combined systolic and     diastolic heart failure/cardiogenic. 5. Hypothyroidism. 6. History of myocardial infarction. 7. History of coronary artery disease. 8. History of gastroesophageal reflux disease. 9. History of hypertension. 10.History of anxiety. 11.History of diverticulosis. 12.Acute respiratory failure -- secondary to acute-on-chronic combined     systolic and diastolic heart failure.  PROCEDURES AND STUDIES: 1. Chest x-ray -- congestive changes in the heart and lungs with     cardiac enlargement, increased pulmonary vascularity, perihilar and     interstitial edema and small pleural effusions. 2. X-ray on November 29, 2010 - worsening CHF with increased     interstitial and air space pulmonary edema and enlarging bilateral     pleural effusions.  Associated passive atelectasis in the lower     lobes. 3. A 2-D echocardiogram -- ejection fraction 20-25%, systolic function     severely reduced, dyskinesis of the entire apical myocardium in the     distribution of the left anterior descending coronary artery, worse     from the study of August 2011.  Features are consistent with a     pseudo normal left ventricular filling pattern with concomitant     abnormal relaxation and increased filling pressure -- grade 2     diastolic dysfunction.  Doppler parameters are consistent with both     elevated  ventricular and end-diastolic filling pressure and     elevated left atrial filling pressure.  CONSULTATIONS: 1. Cardiology -- Dr. Tresa Endo. 2. Palliative care.  BRIEF HISTORY:  The patient is a 77 year old white female with above- listed medical problems who presented with complaints of worsening shortness of breath over a 24-hour period.  EMS was called to the patient's home and she was brought to the ED and nebulized bronchodilators along with Solu-Medrol were administered en route.  In the ED, she was found to be hypoxemic and placed on BiPAP and her O2 sats improved.  In the ED, workup revealed a chest x-ray with pulmonary vascular congestion and small bilateral pleural effusions.  A brain natriuretic peptide was found to be elevated at 34,256 and the patient was given a dose of IV Lasix.  It was noted that the patient's last 2-D echo revealed grade 2 diastolic dysfunction and mild systolic heart failure with an ejection fraction of 40-45% in August 2011.  She was admitted for further evaluation and management.  HOSPITAL COURSE: 1. Acute-on-chronic combined systolic and diastolic dysfunction -- as     discussed above upon admission, her brain natriuretic peptide was     noted to be markedly elevated and chest x-ray revealed findings     consistent with congestive heart failure.  She was placed on BiPAP     and her O2 sats improved and subsequently the BiPAP was changed to     p.r.n.  She was given a dose of IV Lasix on admission and was     maintained on IV Lasix and given her critical aortic stenosis, she     was diuresed with caution.  Cardiology was consulted following her     admission and a [facility] saw the patient.     Initially, she appeared to be responding well to the diuretics.     She was noted to be hyponatremic and it was noted that this was a     sign of the severity and chronicity of her heart failure and     Cardiology stated that her long-term  prognosis was grim, although     at that point they thought that she would improve enough to go     home.  They recommended to consult palliative care indicating that     her life expectancy was likely no more than 6-12 months.  The next     night, she was in the hospital.  She was noted to be hypotensive     and a followup brain natriuretic peptide on November 29, 2010 was     noted to be greater than 70,000 and her troponins were mildly     elevated -- 0.32.  Because of the hypotension, she was given a     small bolus of fluid overnight and her Lasix was put on hold and     also the Vasotec and metoprolol had to be put on hold.  I updated     the patient's family that she was critically ill and that her     prognosis was poor.  Palliative care saw the patient and discussed     goals of care with the family present at which point they were     indicating that they wanted full code status.  On November 29, 2010,     Dr. Allyson Sabal gave the order for the patient to be a do not resuscitate     and the patient's clinical status continued to decline and she was     noted to be restless, agitated and diaphoretic later that day on     November 29, 2010, and was treated with Ativan and morphine.     Subsequently on November 30, 2010 -- at 11 minutes after 4 a.m., the     patient was pronounced dead.  The family was at the bedside and the     on-call MD was notified.     Kela Millin, M.D.     ACV/MEDQ  D:  01/01/2011  T:  01/01/2011  Job:  [id number]  cc:   Dr. Michele Mcalpine  Electronically Signed by Donnalee Curry M.D. on 01/02/2011 07:37:14 AM

## 2011-01-17 NOTE — Consult Note (Signed)
[name] Copeland, [name] Copeland                ACCOUNT NO.:  [id number]  MEDICAL RECORD NO.:  [id number]  LOCATION:  2901                         FACILITY:  [facility]  PHYSICIAN:  Travonte Byard L. Ladona Ridgel, MD  DATE OF BIRTH:  March 17, 1917  DATE OF CONSULTATION:  11/29/2010 DATE OF DISCHARGE:                                CONSULTATION   REFERRING PHYSICIAN:  Nicki Guadalajara, MD  REASON FOR CONSULTATION:  Medical treatment options.  This DNP, Dorian Pod, received report from the team, reviewed medical records, examined the patient, then met with the patient's son, [name] Copeland and daughter, [name] Copeland, to discuss goals, options, and end-of-life issues.  [name] Copeland can be reached at [phone].  Daughter, [name] Copeland, at [phone].  A detailed discussion was had regarding advanced directives with concept specific to the difference between an aggressive medical interventions path versus a palliative comfort care path for this patient at this time in her current situation.  Time in 11 a.m., time out 1 p.m. with a 120-minute consult, greater than 50% of this time spent in medical counseling, coordination of care, discussion of the pathophysiology of the patient's disease process.  PROBLEM LIST:  At this time is as follows: 1. Dyspnea. 2. Failure to thrive. 3. Palliative performance score currently 30% at best. 4. Hypoxia. 5. Tachypnea. 6. Fatigue and deconditioning. 7. Altered mental status secondary to hyponatremia. 8. Hypoxia. 9. Chronic pain secondary to arthritis.  GOALS AT THIS TIME:  Long discussion with the patient's son and daughter as stated above.  The patient with altered mental status at this time. Her adult children feel that the patient would want full code and aggressive care to be continued.  Son, [name] Copeland, states he thinks his mother may have done a living will at one time and they would like to try and locate that living will at her home.  Until they can do that, they would like to  continue full code with aggressive care and they will try and obtain a copy of the living will and bring it in.  Upon returning to the patient's room to further discuss with her any wishes that she might need to express, I found the patient to be in acute pulmonary edema. She was restless, agitated, diaphoretic, clammy, pale, very tachypneic, and tachycardic.  Blood pressure was 148/70 and then read 165/104, this was felt to be inaccurate reading.  Her respirations were around 40. She had wheezes throughout with significant rales.  I called and spoke with Dr. Tresa Endo who has returned my call promptly, stated he was in the emergency room.  I gave the patient 2 mg of morphine.  I requested that he come to the patient's room immediately to assess the patient.  The patient had received 40 mg of Lasix earlier this morning.  There was minimal response to this as noted in her Foley catheter drainage bag. The patient's adult children are at the bedside at that time.  I explained to them what was happening, they once again confirmed to continue full code status.  Dr. Tresa Endo arrived promptly and took over the patient's care.  The patient was transferred to the coronary care unit for further management.  I will continue to follow along for emotional support for this family and education and further clarification of goals of care as appropriate.  Thank you for the opportunity to assist with this patient's care.  This is Dorian Pod, I can be reached at [phone].     Dorian Pod, ACNP   ______________________________ Katharina Caper. Ladona Ridgel, MD    MB/MEDQ  D:  11/29/2010  T:  12/09/2010  Job:  [id number]  cc:   Nicki Guadalajara, M.D.  Electronically Signed by Dorian Pod ACNP on 11/21/2010 01:03:11 PM Electronically Signed by Derenda Mis MD on 12/20/2010 01:05:37 PM

## 2011-11-21 IMAGING — CT CT ANGIO CHEST
2 of 6 series · 18 of 36 positions shown · IV contrast (APPLIED)
Comparison: Chest radiograph 12/02/2009.

CLINICAL DATA: Pulmonary embolus.  Chest pain.  Short of breath.

CT ANGIOGRAPHY CHEST WITH CONTRAST
TECHNIQUE: Multidetector CT imaging of the chest was performed
using the standard protocol during bolus administration of
intravenous contrast.  Multiplanar CT image reconstructions
including MIPs were obtained to evaluate the vascular anatomy.
Contrast:  100 ml Wmnipaque-OVV.

[Series 8: pulm embolism 1.0 b25f thins · axial · 0.59mm/px · z∈[+837,+1070]mm · 17 of 259 slices shown]
[im 13/259  lung]
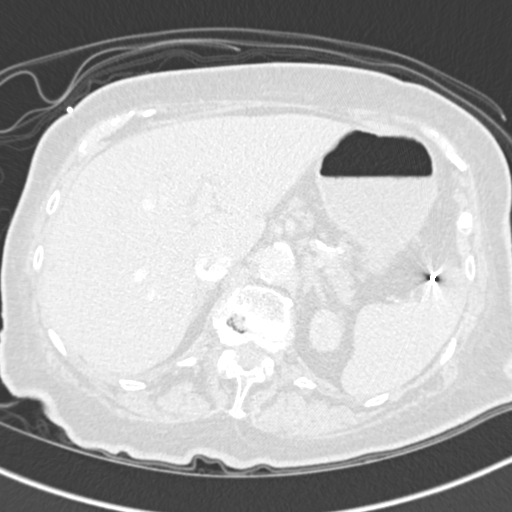
[im 26/259  mediastinal]
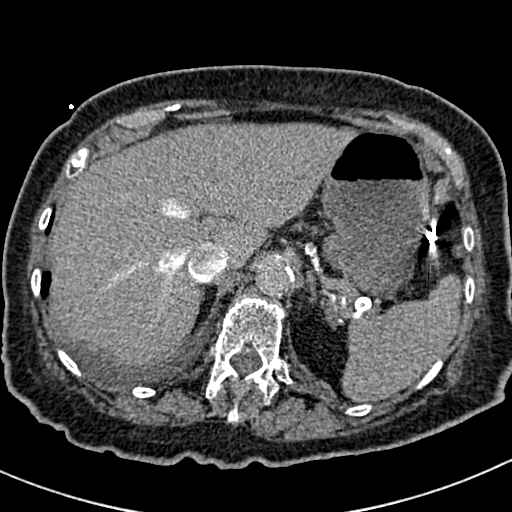
[im 39/259  lung]
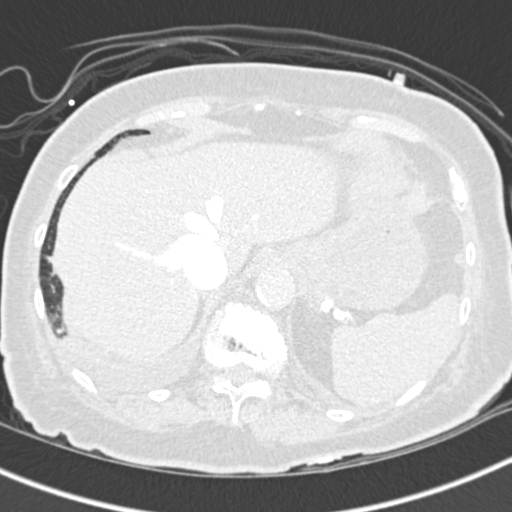
[im 52/259  mediastinal]
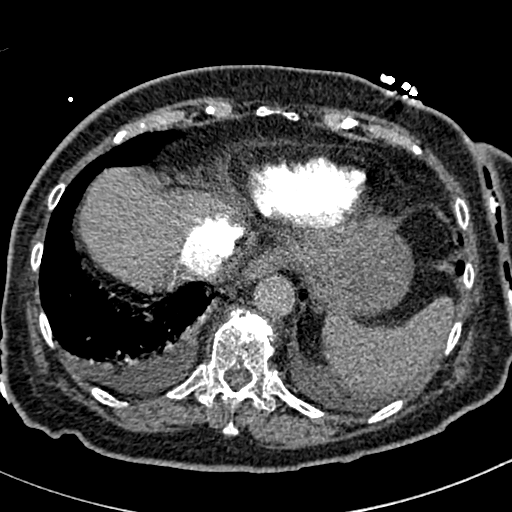
[im 78/259  lung]
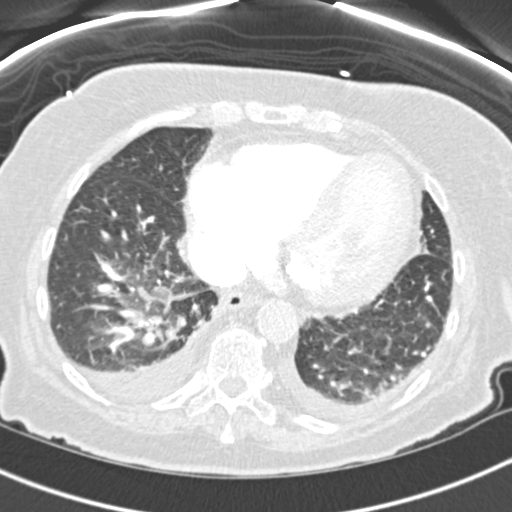
[im 91/259  mediastinal]
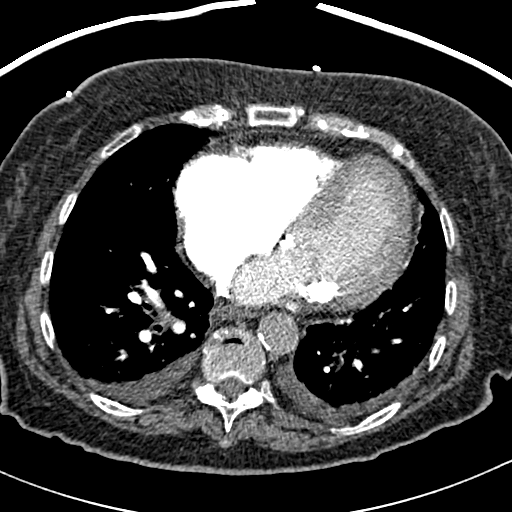
[im 104/259  lung]
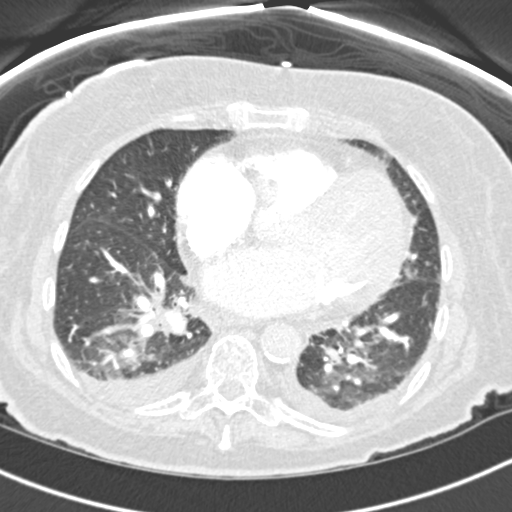
[im 117/259  mediastinal]
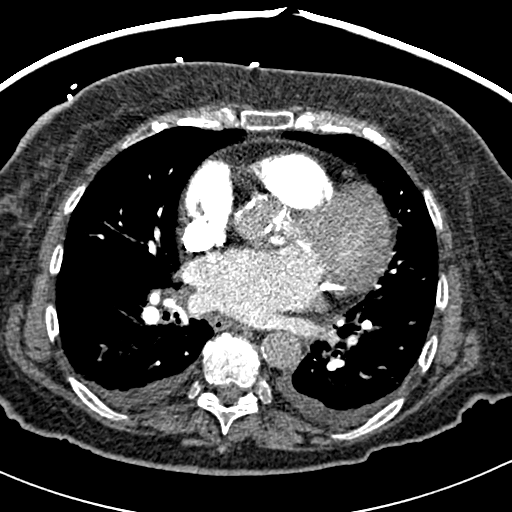
[im 130/259  lung]
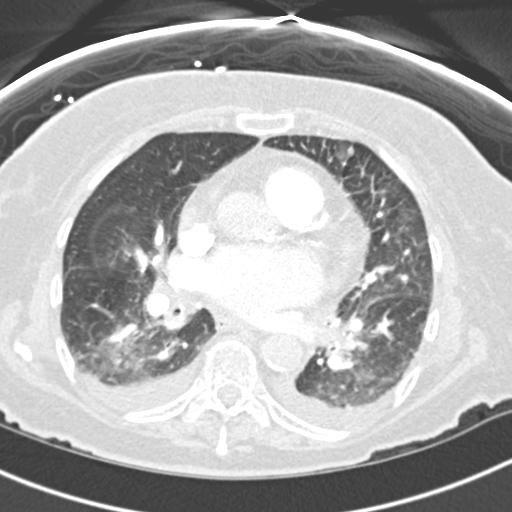
[im 142/259  mediastinal]
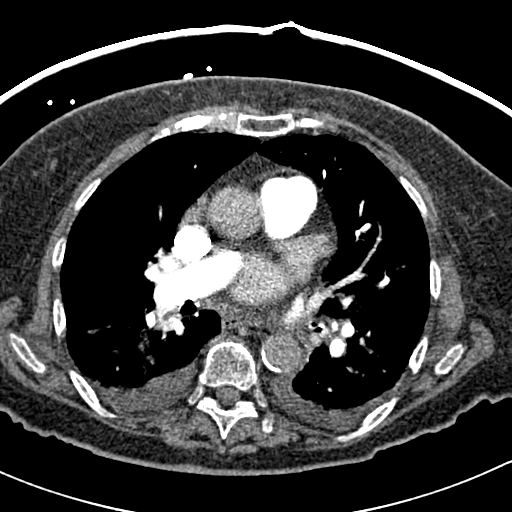
[im 155/259  lung]
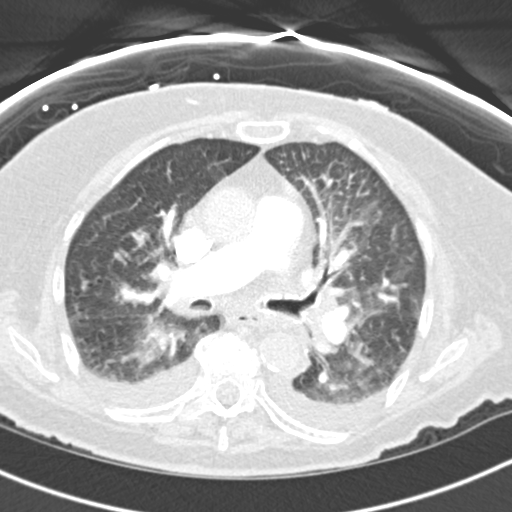
[im 168/259  mediastinal]
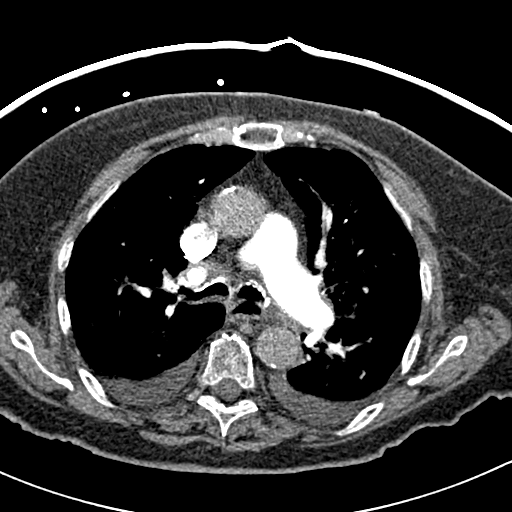
[im 181/259  lung]
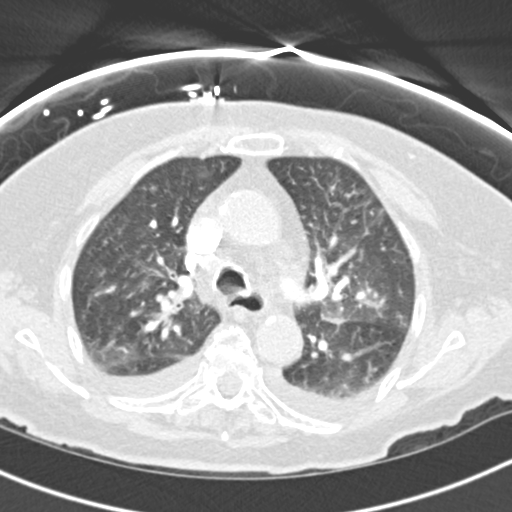
[im 207/259  mediastinal]
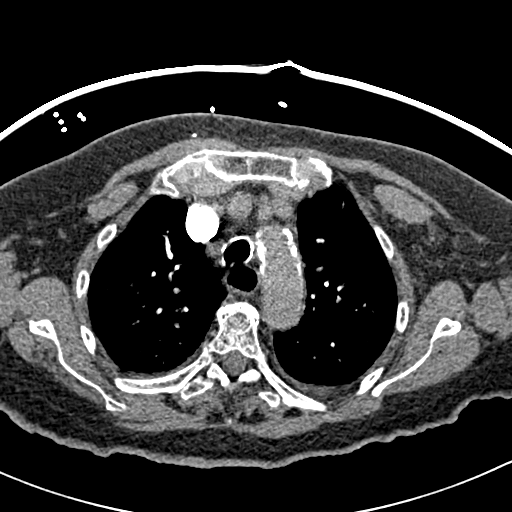
[im 220/259  lung]
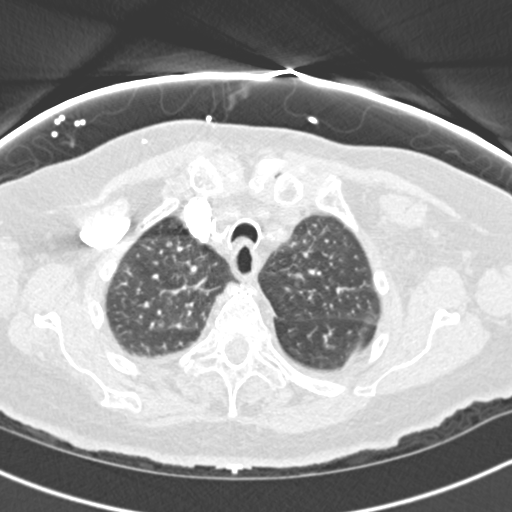
[im 233/259  mediastinal]
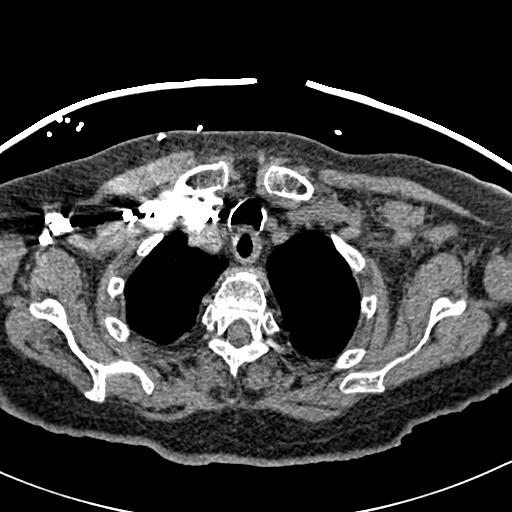
[im 246/259  lung]
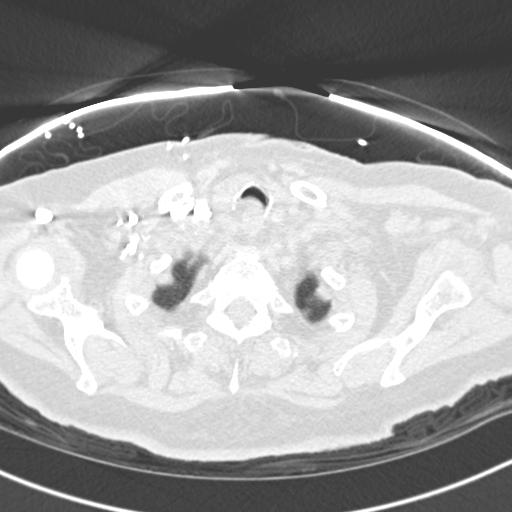

[Series 602: cor mpr · coronal · 0.59mm/px · 1 of 112 slices shown]
[im 56/112  mediastinal]
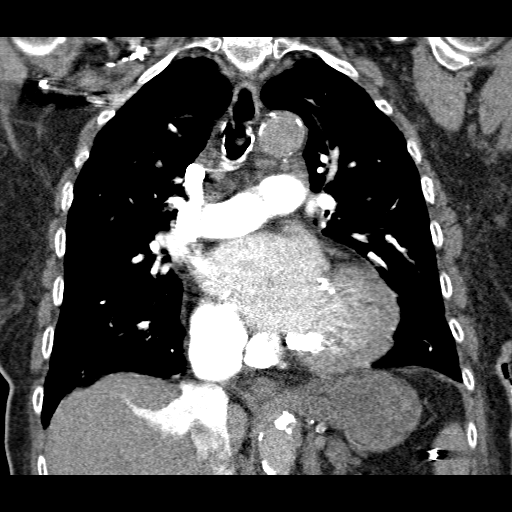

[18 of 36 positions shown; findings below may reference images not displayed]

FINDINGS: Technically adequate study without pulmonary embolus.
Enlargement of the pulmonary arteries with borderline size of the
pulmonary trunk suggesting pulmonary arterial hypertension.
Coronary artery atherosclerosis.  Aortic and branch vessel
atherosclerosis.  Grossly, no acute aortic abnormality.  Reflux of
injected contrast into the hepatic veins consistent with low
cardiac output state.  Surgical clips incidentally noted in the
left upper quadrant. There are small bilateral pleural effusions
with associated compressive atelectasis.  Interlobular septal
thickening is present at the apices and scattered foci of airspace
disease is present most compatible with alveolar pulmonary edema
and CHF.  Small mediastinal lymph nodes are also compatible with
congestive changes associated with CHF.  There is no axillary
adenopathy.  Thoracic spondylosis with partial ankylosis of the T4-
T6 vertebrae. Dense mitral annular calcification is noted.
Cardiomegaly

Review of the MIP images confirms the above findings.
IMPRESSION: 1.  Technically adequate study with without pulmonary embolus.
2.  Constellation of findings consistent with CHF.
3.  Scattered areas of airspace opacity in the lungs most
compatible with alveolar pulmonary edema.  Infection considered
less likely based on other findings.
4. Enlargement of pulmonary arteries suggesting pulmonary arterial
hypertension.  This may be related to CHF.
5.  Cardiomegaly.  Mitral annular calcification.  Atherosclerosis.
